# Patient Record
Sex: Male | Born: 1950 | Race: White | Hispanic: No | Marital: Married | Smoking: Never smoker
Health system: Southern US, Community
[De-identification: ages and names within clinical notes are randomized; demographics above are authoritative.]

## PROBLEM LIST (undated history)

## (undated) DIAGNOSIS — K589 Irritable bowel syndrome without diarrhea: Secondary | ICD-10-CM

## (undated) DIAGNOSIS — Z973 Presence of spectacles and contact lenses: Secondary | ICD-10-CM

## (undated) DIAGNOSIS — I82409 Acute embolism and thrombosis of unspecified deep veins of unspecified lower extremity: Secondary | ICD-10-CM

## (undated) HISTORY — PX: COLONOSCOPY: SHX174

## (undated) HISTORY — PX: TONSILLECTOMY: SUR1361

## (undated) HISTORY — DX: Acute embolism and thrombosis of unspecified deep veins of unspecified lower extremity: I82.409

---

## 2014-10-03 ENCOUNTER — Encounter (HOSPITAL_BASED_OUTPATIENT_CLINIC_OR_DEPARTMENT_OTHER): Payer: Self-pay | Admitting: *Deleted

## 2014-10-03 NOTE — Progress Notes (Signed)
No resp or cardiac problems

## 2014-10-04 ENCOUNTER — Other Ambulatory Visit: Payer: Self-pay | Admitting: Physician Assistant

## 2014-10-04 NOTE — H&P (Signed)
Mr. Austin MilesLundberg is a 64 year-old gentleman, new patient to me who comes to the office with concerns about his left ankle.  He fell off a barstool yesterday, awkwardly rolled his ankle and felt a pop.  Difficulty bearing weight.  He comes into the office in further evaluation with ankle pain and swelling.  His health upon review shows he has no allergies.  No history of diabetes.  Past medical history is significant for mild depression, on Lexapro 5 mg once daily.  Otherwise no other chronic medications.  Past surgical history is unremarkable.  Family history is negative upon review.  He is a nonsmoker, married and is retired.    OBJECTIVE: Height: 5?8.  Weight: 165 pounds.  Blood pressure: 120/80.  Pain and tenderness to the medial ankle, particularly over the medial malleolus.  Mild diffuse soft tissue swelling.  No symptoms over the lateral malleolus.  Achilles is intact.  The ankle is grossly stable.  No skin breakdown.  Good pulse.  He is neurovascularly intact distally.      X-RAYS: Three views of the ankle show a displaced medial malleolar fracture.  ASSESSMENT: Left ankle medial malleolar fracture, displaced.  PLAN: A posterior plaster splint.  Crutches and/or a knee walker for assisted weight bearing.  Percocet 5/325 #30 for pain.  Discussed with him at length the merits of ORIF, left ankle.  Risks, benefits and possible complications reviewed.  Rehab and recover time discussed.  We will see Onalee HuaDavid at time of operative intervention.  Otilio SaberM. Lindsey Tyleigh Mahn, PA-C

## 2014-10-05 ENCOUNTER — Ambulatory Visit (HOSPITAL_BASED_OUTPATIENT_CLINIC_OR_DEPARTMENT_OTHER): Payer: Medicare Other | Admitting: Certified Registered"

## 2014-10-05 ENCOUNTER — Encounter (HOSPITAL_BASED_OUTPATIENT_CLINIC_OR_DEPARTMENT_OTHER): Payer: Self-pay | Admitting: Certified Registered"

## 2014-10-05 ENCOUNTER — Encounter (HOSPITAL_BASED_OUTPATIENT_CLINIC_OR_DEPARTMENT_OTHER)
Admission: RE | Disposition: A | Discharge status: Home / Self Care | Payer: Self-pay | Source: Ambulatory Visit | Attending: Orthopedic Surgery

## 2014-10-05 ENCOUNTER — Ambulatory Visit (HOSPITAL_BASED_OUTPATIENT_CLINIC_OR_DEPARTMENT_OTHER)
Admission: RE | Admit: 2014-10-05 | Discharge: 2014-10-05 | Disposition: A | Discharge status: Home / Self Care | Payer: Medicare Other | Source: Ambulatory Visit | Attending: Orthopedic Surgery | Admitting: Orthopedic Surgery

## 2014-10-05 DIAGNOSIS — W08XXXA Fall from other furniture, initial encounter: Secondary | ICD-10-CM | POA: Insufficient documentation

## 2014-10-05 DIAGNOSIS — Y929 Unspecified place or not applicable: Secondary | ICD-10-CM | POA: Diagnosis not present

## 2014-10-05 DIAGNOSIS — Y999 Unspecified external cause status: Secondary | ICD-10-CM | POA: Diagnosis not present

## 2014-10-05 DIAGNOSIS — Y939 Activity, unspecified: Secondary | ICD-10-CM | POA: Diagnosis not present

## 2014-10-05 DIAGNOSIS — S8252XA Displaced fracture of medial malleolus of left tibia, initial encounter for closed fracture: Secondary | ICD-10-CM | POA: Insufficient documentation

## 2014-10-05 DIAGNOSIS — F329 Major depressive disorder, single episode, unspecified: Secondary | ICD-10-CM | POA: Diagnosis not present

## 2014-10-05 HISTORY — DX: Irritable bowel syndrome, unspecified: K58.9

## 2014-10-05 HISTORY — DX: Presence of spectacles and contact lenses: Z97.3

## 2014-10-05 HISTORY — PX: ORIF ANKLE FRACTURE: SHX5408

## 2014-10-05 SURGERY — OPEN REDUCTION INTERNAL FIXATION (ORIF) ANKLE FRACTURE
Anesthesia: Regional | Site: Ankle | Laterality: Left

## 2014-10-05 MED ORDER — LIDOCAINE HCL (CARDIAC) 20 MG/ML IV SOLN
INTRAVENOUS | Status: DC | PRN
  Administered 2014-10-05: 75 mg via INTRAVENOUS

## 2014-10-05 MED ORDER — HYDROCODONE-ACETAMINOPHEN 7.5-325 MG PO TABS: 1.0000 | ORAL_TABLET | ORAL | Status: DC | PRN

## 2014-10-05 MED ORDER — CHLORHEXIDINE GLUCONATE 4 % EX LIQD: 60.0000 mL | Freq: Once | CUTANEOUS | Status: DC

## 2014-10-05 MED ORDER — METHOCARBAMOL 1000 MG/10ML IJ SOLN: 500.0000 mg | Freq: Four times a day (QID) | INTRAVENOUS | Status: DC | PRN

## 2014-10-05 MED ORDER — METHOCARBAMOL 500 MG PO TABS: 500.0000 mg | ORAL_TABLET | Freq: Four times a day (QID) | ORAL | Status: DC | PRN

## 2014-10-05 MED ORDER — HYDROMORPHONE HCL 1 MG/ML IJ SOLN: 0.2500 mg | INTRAMUSCULAR | Status: DC | PRN

## 2014-10-05 MED ORDER — LACTATED RINGERS IV SOLN
INTRAVENOUS | Status: DC
  Administered 2014-10-05 (×2): via INTRAVENOUS

## 2014-10-05 MED ORDER — FENTANYL CITRATE 0.05 MG/ML IJ SOLN
INTRAMUSCULAR | Status: AC
  Filled 2014-10-05: qty 6

## 2014-10-05 MED ORDER — ONDANSETRON HCL 4 MG/2ML IJ SOLN
INTRAMUSCULAR | Status: DC | PRN
  Administered 2014-10-05: 4 mg via INTRAVENOUS

## 2014-10-05 MED ORDER — DEXAMETHASONE SODIUM PHOSPHATE 10 MG/ML IJ SOLN
INTRAMUSCULAR | Status: DC | PRN
  Administered 2014-10-05: 10 mg via INTRAVENOUS

## 2014-10-05 MED ORDER — FENTANYL CITRATE 0.05 MG/ML IJ SOLN
INTRAMUSCULAR | Status: DC | PRN
  Administered 2014-10-05 (×2): 50 ug via INTRAVENOUS

## 2014-10-05 MED ORDER — OXYCODONE HCL 5 MG PO TABS
5.0000 mg | ORAL_TABLET | Freq: Once | ORAL | Status: AC | PRN
  Administered 2014-10-05: 5 mg via ORAL

## 2014-10-05 MED ORDER — METOCLOPRAMIDE HCL 5 MG PO TABS: 5.0000 mg | ORAL_TABLET | Freq: Three times a day (TID) | ORAL | Status: DC | PRN

## 2014-10-05 MED ORDER — HYDROMORPHONE HCL 1 MG/ML IJ SOLN: 0.5000 mg | INTRAMUSCULAR | Status: DC | PRN

## 2014-10-05 MED ORDER — ONDANSETRON HCL 4 MG PO TABS: 4.0000 mg | ORAL_TABLET | Freq: Four times a day (QID) | ORAL | Status: DC | PRN

## 2014-10-05 MED ORDER — CEFAZOLIN SODIUM-DEXTROSE 2-3 GM-% IV SOLR
INTRAVENOUS | Status: AC
  Filled 2014-10-05: qty 50

## 2014-10-05 MED ORDER — MIDAZOLAM HCL 2 MG/2ML IJ SOLN
INTRAMUSCULAR | Status: AC
  Filled 2014-10-05: qty 2

## 2014-10-05 MED ORDER — OXYCODONE HCL 5 MG/5ML PO SOLN: 5.0000 mg | Freq: Once | ORAL | Status: AC | PRN

## 2014-10-05 MED ORDER — METOCLOPRAMIDE HCL 5 MG/ML IJ SOLN: 5.0000 mg | Freq: Three times a day (TID) | INTRAMUSCULAR | Status: DC | PRN

## 2014-10-05 MED ORDER — BUPIVACAINE-EPINEPHRINE (PF) 0.5% -1:200000 IJ SOLN
INTRAMUSCULAR | Status: DC | PRN
  Administered 2014-10-05: 20 mL via PERINEURAL

## 2014-10-05 MED ORDER — LACTATED RINGERS IV SOLN: INTRAVENOUS | Status: DC

## 2014-10-05 MED ORDER — FENTANYL CITRATE 0.05 MG/ML IJ SOLN
50.0000 ug | INTRAMUSCULAR | Status: DC | PRN
  Administered 2014-10-05: 100 ug via INTRAVENOUS

## 2014-10-05 MED ORDER — FENTANYL CITRATE 0.05 MG/ML IJ SOLN
INTRAMUSCULAR | Status: AC
  Filled 2014-10-05: qty 2

## 2014-10-05 MED ORDER — MIDAZOLAM HCL 2 MG/2ML IJ SOLN
1.0000 mg | INTRAMUSCULAR | Status: DC | PRN
  Administered 2014-10-05: 2 mg via INTRAVENOUS

## 2014-10-05 MED ORDER — PROPOFOL 10 MG/ML IV BOLUS
INTRAVENOUS | Status: DC | PRN
  Administered 2014-10-05: 200 mg via INTRAVENOUS

## 2014-10-05 MED ORDER — ONDANSETRON HCL 4 MG/2ML IJ SOLN: 4.0000 mg | Freq: Four times a day (QID) | INTRAMUSCULAR | Status: DC | PRN

## 2014-10-05 MED ORDER — ONDANSETRON HCL 4 MG/2ML IJ SOLN: 4.0000 mg | Freq: Once | INTRAMUSCULAR | Status: DC | PRN

## 2014-10-05 MED ORDER — CEFAZOLIN SODIUM-DEXTROSE 2-3 GM-% IV SOLR
2.0000 g | INTRAVENOUS | Status: AC
  Administered 2014-10-05: 2 g via INTRAVENOUS

## 2014-10-05 MED ORDER — OXYCODONE HCL 5 MG PO TABS
ORAL_TABLET | ORAL | Status: AC
  Filled 2014-10-05: qty 1

## 2014-10-05 SURGICAL SUPPLY — 65 items
BANDAGE ELASTIC 4 VELCRO ST LF (GAUZE/BANDAGES/DRESSINGS) ×2 IMPLANT
BANDAGE ELASTIC 6 VELCRO ST LF (GAUZE/BANDAGES/DRESSINGS) ×2 IMPLANT
BANDAGE ESMARK 6X9 LF (GAUZE/BANDAGES/DRESSINGS) ×1 IMPLANT
BENZOIN TINCTURE PRP APPL 2/3 (GAUZE/BANDAGES/DRESSINGS) IMPLANT
BIT DRILL CANN 2.7X625 NONSTRL (BIT) ×2 IMPLANT
BLADE SURG 15 STRL LF DISP TIS (BLADE) ×1 IMPLANT
BLADE SURG 15 STRL SS (BLADE) ×1
BNDG COHESIVE 4X5 TAN STRL (GAUZE/BANDAGES/DRESSINGS) ×2 IMPLANT
BNDG ESMARK 6X9 LF (GAUZE/BANDAGES/DRESSINGS) ×2
CANISTER SUCT 1200ML W/VALVE (MISCELLANEOUS) ×2 IMPLANT
COVER BACK TABLE 60X90IN (DRAPES) ×2 IMPLANT
CUFF TOURNIQUET SINGLE 18IN (TOURNIQUET CUFF) ×2 IMPLANT
CUFF TOURNIQUET SINGLE 34IN LL (TOURNIQUET CUFF) IMPLANT
DECANTER SPIKE VIAL GLASS SM (MISCELLANEOUS) IMPLANT
DRAPE EXTREMITY T 121X128X90 (DRAPE) ×2 IMPLANT
DRAPE OEC MINIVIEW 54X84 (DRAPES) ×2 IMPLANT
DRAPE SURG 17X23 STRL (DRAPES) ×2 IMPLANT
DRAPE U 20/CS (DRAPES) ×2 IMPLANT
DRAPE U-SHAPE 47X51 STRL (DRAPES) ×2 IMPLANT
DRSG PAD ABDOMINAL 8X10 ST (GAUZE/BANDAGES/DRESSINGS) ×2 IMPLANT
DURAPREP 26ML APPLICATOR (WOUND CARE) ×2 IMPLANT
ELECT REM PT RETURN 9FT ADLT (ELECTROSURGICAL) ×2
ELECTRODE REM PT RTRN 9FT ADLT (ELECTROSURGICAL) ×1 IMPLANT
GAUZE SPONGE 4X4 12PLY STRL (GAUZE/BANDAGES/DRESSINGS) ×2 IMPLANT
GAUZE XEROFORM 1X8 LF (GAUZE/BANDAGES/DRESSINGS) ×2 IMPLANT
GLOVE BIOGEL PI IND STRL 7.0 (GLOVE) ×1 IMPLANT
GLOVE BIOGEL PI INDICATOR 7.0 (GLOVE) ×1
GLOVE ECLIPSE 6.5 STRL STRAW (GLOVE) ×2 IMPLANT
GLOVE ECLIPSE 7.0 STRL STRAW (GLOVE) ×2 IMPLANT
GLOVE ORTHO TXT STRL SZ7.5 (GLOVE) ×4 IMPLANT
GLOVE SURG ORTHO 8.0 STRL STRW (GLOVE) ×2 IMPLANT
GOWN STRL REUS W/ TWL LRG LVL3 (GOWN DISPOSABLE) ×2 IMPLANT
GOWN STRL REUS W/ TWL XL LVL3 (GOWN DISPOSABLE) ×1 IMPLANT
GOWN STRL REUS W/TWL LRG LVL3 (GOWN DISPOSABLE) ×2
GOWN STRL REUS W/TWL XL LVL3 (GOWN DISPOSABLE) ×1 IMPLANT
GUIDEWIRE THREADED 150MM (WIRE) ×4 IMPLANT
NEEDLE HYPO 25X1 1.5 SAFETY (NEEDLE) IMPLANT
NS IRRIG 1000ML POUR BTL (IV SOLUTION) ×2 IMPLANT
PACK BASIN DAY SURGERY FS (CUSTOM PROCEDURE TRAY) ×2 IMPLANT
PAD CAST 4YDX4 CTTN HI CHSV (CAST SUPPLIES) ×1 IMPLANT
PADDING CAST COTTON 4X4 STRL (CAST SUPPLIES) ×1
PADDING CAST COTTON 6X4 STRL (CAST SUPPLIES) ×2 IMPLANT
PENCIL BUTTON HOLSTER BLD 10FT (ELECTRODE) IMPLANT
SCREW CANN L THRD/30 4.0 (Screw) ×2 IMPLANT
SLEEVE SCD COMPRESS KNEE MED (MISCELLANEOUS) ×2 IMPLANT
SPLINT FAST PLASTER 5X30 (CAST SUPPLIES) ×20
SPLINT FIBERGLASS 4X30 (CAST SUPPLIES) IMPLANT
SPLINT PLASTER CAST FAST 5X30 (CAST SUPPLIES) ×20 IMPLANT
SPONGE LAP 4X18 X RAY DECT (DISPOSABLE) ×2 IMPLANT
STAPLER VISISTAT 35W (STAPLE) IMPLANT
STOCKINETTE 6  STRL (DRAPES) ×1
STOCKINETTE 6 STRL (DRAPES) ×1 IMPLANT
STRIP CLOSURE SKIN 1/2X4 (GAUZE/BANDAGES/DRESSINGS) IMPLANT
SUCTION FRAZIER TIP 10 FR DISP (SUCTIONS) ×2 IMPLANT
SUT ETHILON 3 0 PS 1 (SUTURE) ×2 IMPLANT
SUT VIC AB 0 CT1 27 (SUTURE) ×1
SUT VIC AB 0 CT1 27XBRD ANBCTR (SUTURE) ×1 IMPLANT
SUT VIC AB 2-0 SH 27 (SUTURE)
SUT VIC AB 2-0 SH 27XBRD (SUTURE) IMPLANT
SUT VICRYL 4-0 PS2 18IN ABS (SUTURE) IMPLANT
SYR BULB 3OZ (MISCELLANEOUS) ×2 IMPLANT
SYR CONTROL 10ML LL (SYRINGE) IMPLANT
TUBE CONNECTING 20X1/4 (TUBING) ×2 IMPLANT
UNDERPAD 30X30 INCONTINENT (UNDERPADS AND DIAPERS) ×2 IMPLANT
YANKAUER SUCT BULB TIP NO VENT (SUCTIONS) IMPLANT

## 2014-10-05 NOTE — Anesthesia Procedure Notes (Addendum)
Anesthesia Regional Block:  Adductor canal block  Pre-Anesthetic Checklist: ,, timeout performed, Correct Patient, Correct Site, Correct Laterality, Correct Procedure, Correct Position, site marked, Risks and benefits discussed,  Surgical consent,  Pre-op evaluation,  At surgeon's request and post-op pain management  Laterality: Left and Lower  Prep: chloraprep       Needles:  Injection technique: Single-shot  Needle Type: Echogenic Needle     Needle Length: 9cm 9 cm Needle Gauge: 21 and 21 G    Additional Needles:  Procedures: ultrasound guided (picture in chart) Adductor canal block Narrative:  Start time: 10/05/2014 9:24 AM End time: 10/05/2014 9:29 AM Injection made incrementally with aspirations every 5 mL.  Performed by: Personally  Anesthesiologist: CREWS, Asar   Procedure Name: LMA Insertion Date/Time: 10/05/2014 9:48 AM Performed by: Curly ShoresRAFT, Cortney Mckinney W Pre-anesthesia Checklist: Patient identified, Emergency Drugs available, Suction available and Patient being monitored Patient Re-evaluated:Patient Re-evaluated prior to inductionOxygen Delivery Method: Circle System Utilized Preoxygenation: Pre-oxygenation with 100% oxygen Intubation Type: IV induction Ventilation: Mask ventilation without difficulty LMA: LMA inserted LMA Size: 5.0 Number of attempts: 1 Airway Equipment and Method: Bite block Placement Confirmation: positive ETCO2 and breath sounds checked- equal and bilateral Tube secured with: Tape Dental Injury: Teeth and Oropharynx as per pre-operative assessment

## 2014-10-05 NOTE — Interval H&P Note (Signed)
History and Physical Interval Note:  10/05/2014 7:33 AM  Keith Wheeler  has presented today for surgery, with the diagnosis of DISPLACED FRACTURE OF MEDIAL MALLEOLUS OF UNSPECIFIED TIBIA/INITIAL ENCOUNTER FOR CLOSED FRACTURE   The various methods of treatment have been discussed with the patient and family. After consideration of risks, benefits and other options for treatment, the patient has consented to  Procedure(s) with comments: LEFT ANKLE OPEN REDUCTION INTERNAL FIXATION (ORIF) MEDIAL MALLEOLUS (Left) - ANESTHESIA:  GENERAL, BLOCK as a surgical intervention .  The patient's history has been reviewed, patient examined, no change in status, stable for surgery.  I have reviewed the patient's chart and labs.  Questions were answered to the patient's satisfaction.     Theta Leaf F

## 2014-10-05 NOTE — Transfer of Care (Signed)
Immediate Anesthesia Transfer of Care Note  Patient: Keith FeltyDavid J Lindfors  Procedure(s) Performed: Procedure(s) with comments: LEFT ANKLE OPEN REDUCTION INTERNAL FIXATION (ORIF) MEDIAL MALLEOLUS (Left) - ANESTHESIA:  GENERAL, BLOCK  Patient Location: PACU  Anesthesia Type:GA combined with regional for post-op pain  Level of Consciousness: awake and patient cooperative  Airway & Oxygen Therapy: Patient Spontanous Breathing and Patient connected to face mask oxygen  Post-op Assessment: Report given to RN, Post -op Vital signs reviewed and stable and Patient moving all extremities  Post vital signs: Reviewed and stable  Last Vitals:  Filed Vitals:   10/05/14 0935  BP:   Pulse: 55  Temp:   Resp: 11    Complications: No apparent anesthesia complications

## 2014-10-05 NOTE — Anesthesia Preprocedure Evaluation (Signed)
Anesthesia Evaluation  Patient identified by MRN, date of birth, ID band Patient awake    Reviewed: Allergy & Precautions, NPO status , Patient's Chart, lab work & pertinent test results  Airway Mallampati: I  TM Distance: >3 FB Neck ROM: Full    Dental  (+) Teeth Intact, Dental Advisory Given   Pulmonary  breath sounds clear to auscultation        Cardiovascular Rhythm:Regular Rate:Normal     Neuro/Psych    GI/Hepatic   Endo/Other    Renal/GU      Musculoskeletal   Abdominal   Peds  Hematology   Anesthesia Other Findings   Reproductive/Obstetrics                             Anesthesia Physical Anesthesia Plan  ASA: II  Anesthesia Plan: General   Post-op Pain Management:    Induction: Intravenous  Airway Management Planned: LMA  Additional Equipment:   Intra-op Plan:   Post-operative Plan: Extubation in OR  Informed Consent: I have reviewed the patients History and Physical, chart, labs and discussed the procedure including the risks, benefits and alternatives for the proposed anesthesia with the patient or authorized representative who has indicated his/her understanding and acceptance.   Dental advisory given  Plan Discussed with: CRNA, Anesthesiologist and Surgeon  Anesthesia Plan Comments:         Anesthesia Quick Evaluation  

## 2014-10-05 NOTE — Anesthesia Postprocedure Evaluation (Signed)
  Anesthesia Post-op Note  Patient: Keith FeltyDavid J Feliz  Procedure(s) Performed: Procedure(s) with comments: LEFT ANKLE OPEN REDUCTION INTERNAL FIXATION (ORIF) MEDIAL MALLEOLUS (Left) - ANESTHESIA:  GENERAL, BLOCK  Patient Location: PACU  Anesthesia Type: General, Regional   Level of Consciousness: awake, alert  and oriented  Airway and Oxygen Therapy: Patient Spontanous Breathing  Post-op Pain: mild  Post-op Assessment: Post-op Vital signs reviewed  Post-op Vital Signs: Reviewed  Last Vitals:  Filed Vitals:   10/05/14 1245  BP: 150/90  Pulse: 62  Temp: 36.8 C  Resp: 16    Complications: No apparent anesthesia complications

## 2014-10-05 NOTE — H&P (View-Only) (Signed)
Mr. Keith Wheeler is a 64 year-old gentleman, new patient to me who comes to the office with concerns about his left ankle.  He fell off a barstool yesterday, awkwardly rolled his ankle and felt a pop.  Difficulty bearing weight.  He comes into the office in further evaluation with ankle pain and swelling.  His health upon review shows he has no allergies.  No history of diabetes.  Past medical history is significant for mild depression, on Lexapro 5 mg once daily.  Otherwise no other chronic medications.  Past surgical history is unremarkable.  Family history is negative upon review.  He is a nonsmoker, married and is retired.    OBJECTIVE: Height: 5?8.  Weight: 165 pounds.  Blood pressure: 120/80.  Pain and tenderness to the medial ankle, particularly over the medial malleolus.  Mild diffuse soft tissue swelling.  No symptoms over the lateral malleolus.  Achilles is intact.  The ankle is grossly stable.  No skin breakdown.  Good pulse.  He is neurovascularly intact distally.      X-RAYS: Three views of the ankle show a displaced medial malleolar fracture.  ASSESSMENT: Left ankle medial malleolar fracture, displaced.  PLAN: A posterior plaster splint.  Crutches and/or a knee walker for assisted weight bearing.  Percocet 5/325 #30 for pain.  Discussed with him at length the merits of ORIF, left ankle.  Risks, benefits and possible complications reviewed.  Rehab and recover time discussed.  We will see Srihith at time of operative intervention.  M. Lindsey Stanbery, PA-C 

## 2014-10-05 NOTE — Progress Notes (Signed)
Assisted Dr. Crews with left, ultrasound guided, popliteal/saphenous block. Side rails up, monitors on throughout procedure. See vital signs in flow sheet. Tolerated Procedure well. 

## 2014-10-05 NOTE — Discharge Instructions (Signed)
HOME CARE INSTRUCTIONS  Non-weight bearing.  Do not remove splint until seen in office.  May shower, but do not let splint get wet.  Follow up appointment in one week.   You may resume normal diet and activities as directed or allowed.  Elevate your ankle above your heart as much as possible for the first 24-48 hrs postoperatively.  Change dressings if necessary or as directed.  If you have a plaster or fiberglass cast:  Do not try to scratch the skin under the cast using sharp or pointed objects.  Check the skin around the cast every day. You may put lotion on any red or sore areas.  Keep your cast dry and clean.  Your cast or splint can be protected during bathing with a plastic bag. Do not lower the cast or splint into water.  Only take over-the-counter or prescription medicines for pain, discomfort, or fever as directed by your caregiver.  Use crutches as directed and do not exercise leg unless instructed.  Keep appointments as directed. SEEK IMMEDIATE MEDICAL CARE IF:  There is redness, swelling, or increasing pain in the wound.  There is pus coming from wound.  You have an unexplained oral temperature above 102 F (38.9 C) develops.  There is a bad smell coming from the wound or dressing.  You have increasing pain coming from the area of the wound. Especially when someone else moves your toes.  The wound breaks open (edges not staying together) after sutures or staples have been removed. If you do not have a window in your cast for observing the wound, a discharge or minor bleeding may show up as a stain on the outside of your cast. Report these findings to your caregiver. Document Released: 03/22/2002 Document Revised: 11/14/2013 Document Reviewed: 09/22/2007 [facility] Patient Information 2015 [employer] [place]. This information is not intended to replace advice given to you by your health care provider. Make sure you discuss any questions you have with your health care  provider.    Post Anesthesia Home Care Instructions  Activity: Get plenty of rest for the remainder of the day. A responsible adult should stay with you for 24 hours following the procedure.  For the next 24 hours, DO NOT: -Drive a car -Advertising copywriterperate machinery -Drink alcoholic beverages -Take any medication unless instructed by your physician -Make any legal decisions or sign important papers.  Meals: Start with liquid foods such as gelatin or soup. Progress to regular foods as tolerated. Avoid greasy, spicy, heavy foods. If nausea and/or vomiting occur, drink only clear liquids until the nausea and/or vomiting subsides. Call your physician if vomiting continues.  Special Instructions/Symptoms: Your throat may feel dry or sore from the anesthesia or the breathing tube placed in your throat during surgery. If this causes discomfort, gargle with warm salt water. The discomfort should disappear within 24 hours.

## 2014-10-06 NOTE — Op Note (Signed)
NAMCannon [place]:  Wheeler, Keith             ACCOUNT NO.:  [id number]  MEDICAL RECORD NO.:  [id number]  LOCATION:                               FACILITY:  [facility]  PHYSICIAN:  Loreta Aveaniel F. Murphy, M.D. DATE OF BIRTH:  1950-11-16  DATE OF PROCEDURE:  10/05/2014 DATE OF DISCHARGE:  10/05/2014                              OPERATIVE REPORT   PREOPERATIVE DIAGNOSIS:  Markedly displaced distal medial malleolus fracture, left ankle.  POSTOPERATIVE DIAGNOSIS:  Markedly displaced distal medial malleolus fracture, left ankle.  PROCEDURE:  Open reduction and internal fixation of left ankle fracture with a single cannulated 35 mm x 4 mm screw.  SURGEON:  Loreta Aveaniel F. Murphy, M.D.  ASSISTANT:  Keith Wheeler, P.A.  ANESTHESIA:  General.  BLOOD LOSS:  Minimal.  SPECIMENS:  None.  CULTURES:  None.  COMPLICATIONS:  None.  DRESSINGS:  Sterile compressive, short leg splint.  TOURNIQUET TIME:  45 minutes.  PROCEDURE IN DETAIL:  The patient was brought to operating room, placed on the operating table in supine position.  After adequate anesthesia had been obtained, tourniquet applied.  Prepped and draped in usual sterile fashion.  Exsanguinated with elevation of Esmarch.  Tourniquet inflated to 350 mmHg.  Fluoroscopic guidance was used throughout.  Ankle was stable laterally and syndesmosis appeared on the medial side.  The distal end of the medial malleolus had avulse off, tilted 30 degrees into the joint, and then forwarded so that it was displaced and unstable.  This approached with a vertical incision.  Skin and subcutaneous tissue divided.  Fracture identified, cleared out, reduced anatomically.  This is a relatively small fragment.  I temporarily fixed with 2 guidewires and then permanently with a single screw to the middle of the fragment.  This was small enough, I could not get a second screw in.  Fortunately, the fracture pattern was such that rotational stability was good with a single  screw alone.  This was countersunk as much as possible.  Alignment checked, it was good.  Great motion.  Wound irrigated.  Closed with nylon.  Sterile compressive dressing applied.  Short-leg splint applied.  Tourniquet deflated and removed.  Anesthesia reversed.  Brought to the recovery room.  Tolerated the surgery well.  No complications.     Loreta Aveaniel F. Murphy, M.D.     DFM/MEDQ  D:  10/05/2014  T:  10/05/2014  Job:  [id number]

## 2014-10-10 ENCOUNTER — Encounter (HOSPITAL_BASED_OUTPATIENT_CLINIC_OR_DEPARTMENT_OTHER): Payer: Self-pay | Admitting: Orthopedic Surgery

## 2018-04-11 ENCOUNTER — Emergency Department (HOSPITAL_COMMUNITY): Payer: Medicare Other

## 2018-04-11 ENCOUNTER — Emergency Department (HOSPITAL_COMMUNITY)
Admission: EM | Admit: 2018-04-11 | Discharge: 2018-04-11 | Disposition: A | Discharge status: Home / Self Care | Payer: Medicare Other | Attending: Emergency Medicine | Admitting: Emergency Medicine

## 2018-04-11 ENCOUNTER — Other Ambulatory Visit: Payer: Self-pay

## 2018-04-11 ENCOUNTER — Encounter (HOSPITAL_COMMUNITY): Payer: Self-pay | Admitting: *Deleted

## 2018-04-11 DIAGNOSIS — N201 Calculus of ureter: Secondary | ICD-10-CM | POA: Insufficient documentation

## 2018-04-11 DIAGNOSIS — Z79899 Other long term (current) drug therapy: Secondary | ICD-10-CM | POA: Insufficient documentation

## 2018-04-11 DIAGNOSIS — R1031 Right lower quadrant pain: Secondary | ICD-10-CM | POA: Diagnosis present

## 2018-04-11 DIAGNOSIS — N200 Calculus of kidney: Secondary | ICD-10-CM

## 2018-04-11 LAB — URINALYSIS, ROUTINE W REFLEX MICROSCOPIC
BACTERIA UA: NONE SEEN
Bilirubin Urine: NEGATIVE
GLUCOSE, UA: NEGATIVE mg/dL
KETONES UR: 20 mg/dL — AB
LEUKOCYTES UA: NEGATIVE
Nitrite: NEGATIVE
PROTEIN: NEGATIVE mg/dL
RBC / HPF: >50 RBC/hpf — ABNORMAL HIGH (ref 0–5)
Specific Gravity, Urine: 1.016 (ref 1.005–1.030)
pH: 5 (ref 5.0–8.0)

## 2018-04-11 LAB — COMPREHENSIVE METABOLIC PANEL
ALT: 58 U/L — AB (ref 0–44)
AST: 68 U/L — AB (ref 15–41)
Albumin: 4.1 g/dL (ref 3.5–5.0)
Alkaline Phosphatase: 59 U/L (ref 38–126)
Anion gap: 11 (ref 5–15)
BUN: 12 mg/dL (ref 8–23)
CHLORIDE: 105 mmol/L (ref 98–111)
CO2: 26 mmol/L (ref 22–32)
CREATININE: 0.95 mg/dL (ref 0.61–1.24)
Calcium: 9.1 mg/dL (ref 8.9–10.3)
GFR calc Af Amer: >60 mL/min (ref >60)
GFR calc non Af Amer: >60 mL/min (ref >60)
GLUCOSE: 126 mg/dL — AB (ref 70–99)
POTASSIUM: 3.3 mmol/L — AB (ref 3.5–5.1)
Sodium: 142 mmol/L (ref 135–145)
Total Bilirubin: 1.8 mg/dL — ABNORMAL HIGH (ref 0.3–1.2)
Total Protein: 7 g/dL (ref 6.5–8.1)

## 2018-04-11 LAB — CBC
HCT: 44.7 % (ref 39.0–52.0)
Hemoglobin: 15.5 g/dL (ref 13.0–17.0)
MCH: 34.8 pg — AB (ref 26.0–34.0)
MCHC: 34.7 g/dL (ref 30.0–36.0)
MCV: 100.4 fL — AB (ref 78.0–100.0)
PLATELETS: 164 10*3/uL (ref 150–400)
RBC: 4.45 MIL/uL (ref 4.22–5.81)
RDW: 12.2 % (ref 11.5–15.5)
WBC: 4.8 10*3/uL (ref 4.0–10.5)

## 2018-04-11 LAB — LIPASE, BLOOD: LIPASE: 40 U/L (ref 11–51)

## 2018-04-11 MED ORDER — TAMSULOSIN HCL 0.4 MG PO CAPS: 0.4000 mg | ORAL_CAPSULE | Freq: Every day | ORAL | 0 refills | Status: DC

## 2018-04-11 MED ORDER — IBUPROFEN 600 MG PO TABS: 600.0000 mg | ORAL_TABLET | Freq: Four times a day (QID) | ORAL | 0 refills | Status: DC | PRN

## 2018-04-11 MED ORDER — FENTANYL CITRATE (PF) 100 MCG/2ML IJ SOLN
50.0000 ug | INTRAMUSCULAR | Status: DC | PRN
  Administered 2018-04-11: 50 ug via INTRAVENOUS
  Filled 2018-04-11: qty 2

## 2018-04-11 MED ORDER — ONDANSETRON 4 MG PO TBDP: 4.0000 mg | ORAL_TABLET | ORAL | 0 refills | Status: DC | PRN

## 2018-04-11 MED ORDER — DOCUSATE SODIUM 100 MG PO CAPS: 100.0000 mg | ORAL_CAPSULE | Freq: Two times a day (BID) | ORAL | 0 refills | Status: DC

## 2018-04-11 MED ORDER — ONDANSETRON HCL 4 MG/2ML IJ SOLN
4.0000 mg | Freq: Once | INTRAMUSCULAR | Status: AC | PRN
  Administered 2018-04-11: 4 mg via INTRAVENOUS
  Filled 2018-04-11: qty 2

## 2018-04-11 MED ORDER — OXYCODONE-ACETAMINOPHEN 5-325 MG PO TABS: 1.0000 | ORAL_TABLET | Freq: Four times a day (QID) | ORAL | 0 refills | Status: DC | PRN

## 2018-04-11 NOTE — ED Notes (Signed)
Pt using restroom

## 2018-04-11 NOTE — ED Notes (Signed)
Patient transported to CT 

## 2018-04-11 NOTE — ED Notes (Signed)
Pt unable to sign due to E-Signature pad not working at time.

## 2018-04-11 NOTE — ED Notes (Signed)
Pt aware urine sample is needed 

## 2018-04-11 NOTE — Discharge Instructions (Addendum)
1.  Take ibuprofen for the next 2 days for pain control.  Then you may move to as needed use.  Take Percocet if pain is starting to escalate.  Take Zofran if nausea. 2.  Return to the emergency department if pain cannot be controlled at home by medications. 3.  Follow-up with your urologist or [facility] within the next week. 4.  Have your family doctor and your urologist review the results of your CT scan for incidental findings that may need further evaluation or monitoring.  This was reviewed with you in the emergency department.

## 2018-04-11 NOTE — ED Triage Notes (Signed)
Pt reports sudden onset of constant lower abdominal pain tonight, associated with nausea and vomiting. Pt denies urinary symptoms.

## 2018-04-11 NOTE — ED Provider Notes (Signed)
[facility]-EMERGENCY DEPT Provider Note   CSN: [id number] Arrival date & time: 04/11/18  0531     History   Chief Complaint Chief Complaint  Patient presents with  . Abdominal Pain    HPI Keith Wheeler is a 67 y.o. male.  HPI Patient awakened from sleep by severe pain in his right inguinal region.  Very sharp.  He could not find any comfortable position.  Tried to drink some water and immediately vomited.  No associated pain or burning with urination.  Patient has not seen blood in the urine.  He had a normal day yesterday.  He went to the [place] vessel dance and felt well all day.  He reports just before getting pain medications in the emergency department, the pain suddenly went away.  He reports he has no pain now.  No history of kidney stones.  Otherwise healthy. Past Medical History:  Diagnosis Date  . IBS (irritable bowel syndrome)   . Wears glasses     There are no active problems to display for this patient.   Past Surgical History:  Procedure Laterality Date  . COLONOSCOPY    . ORIF ANKLE FRACTURE Left 10/05/2014   Procedure: LEFT ANKLE OPEN REDUCTION INTERNAL FIXATION (ORIF) MEDIAL MALLEOLUS;  Surgeon: Mckinley Jewel, MD;  Location: [facility];  Service: Orthopedics;  Laterality: Left;  ANESTHESIA:  GENERAL, BLOCK  . TONSILLECTOMY          Home Medications    Prior to Admission medications   Medication Sig Start Date End Date Taking? Authorizing Provider  escitalopram (LEXAPRO) 5 MG tablet Take 5 mg by mouth at bedtime.   Yes [provider]  Multiple Vitamins-Minerals (MULTIVITAMIN WITH MINERALS) tablet Take 1 tablet by mouth daily.   Yes [provider]  NON FORMULARY every morning. ostragalus root   Yes [provider]  docusate sodium (COLACE) 100 MG capsule Take 1 capsule (100 mg total) by mouth every 12 (twelve) hours. 04/11/18   Arby Barrette, MD  ibuprofen (ADVIL,MOTRIN) 600 MG tablet  Take 1 tablet (600 mg total) by mouth every 6 (six) hours as needed. 04/11/18   Arby Barrette, MD  ondansetron (ZOFRAN ODT) 4 MG disintegrating tablet Take 1 tablet (4 mg total) by mouth every 4 (four) hours as needed for nausea or vomiting. 04/11/18   Arby Barrette, MD  oxyCODONE-acetaminophen (PERCOCET) 5-325 MG tablet Take 1-2 tablets by mouth every 6 (six) hours as needed. 04/11/18   Arby Barrette, MD  tamsulosin (FLOMAX) 0.4 MG CAPS capsule Take 1 capsule (0.4 mg total) by mouth daily. 04/11/18   Arby Barrette, MD    Family History No family history on file.  Social History Social History   Tobacco Use  . Smoking status: Never Smoker  Substance Use Topics  . Alcohol use: Yes    Comment: 4 glasses wine a night  . Drug use: No     Allergies   Rice and Sardine oil   Review of Systems Review of Systems 10 Systems reviewed and are negative for acute change except as noted in the HPI.   Physical Exam Updated Vital Signs BP 129/89   Pulse 71   Temp 97.7 F (36.5 C)   Resp 17   SpO2 100%   Physical Exam  Constitutional: He is oriented to person, place, and time. He appears well-developed and well-nourished. No distress.  HENT:  Head: Normocephalic and atraumatic.  Eyes: EOM are normal.  Cardiovascular: Normal rate, regular  rhythm, normal heart sounds and intact distal pulses.  Pulmonary/Chest: Effort normal and breath sounds normal.  Abdominal: Soft. He exhibits no distension. There is no tenderness. There is no guarding.  Genitourinary:  Genitourinary Comments: No pain to palpation in the inguinal canal.  No mass or fullness within the right inguinal canal with Valsalva maneuver.  Right testicle nontender normal contours.  No scrotal edema or swelling.  Penis normal.  Musculoskeletal: Normal range of motion. He exhibits no edema or tenderness.  Neurological: He is alert and oriented to person, place, and time. He exhibits normal muscle tone. Coordination normal.      ED Treatments / Results  Labs (all labs ordered are listed, but only abnormal results are displayed) Labs Reviewed  COMPREHENSIVE METABOLIC PANEL - Abnormal; Notable for the following components:      Result Value   Potassium 3.3 (*)    Glucose, Bld 126 (*)    AST 68 (*)    ALT 58 (*)    Total Bilirubin 1.8 (*)    All other components within normal limits  CBC - Abnormal; Notable for the following components:   MCV 100.4 (*)    MCH 34.8 (*)    All other components within normal limits  URINALYSIS, ROUTINE W REFLEX MICROSCOPIC - Abnormal; Notable for the following components:   Hgb urine dipstick LARGE (*)    Ketones, ur 20 (*)    RBC / HPF >50 (*)    All other components within normal limits  LIPASE, BLOOD    EKG None  Radiology Ct Renal Stone Study  Result Date: 04/11/2018 CLINICAL DATA:  Lower abdominal and inguinal region pain on the right EXAM: CT ABDOMEN AND PELVIS WITHOUT CONTRAST TECHNIQUE: Multidetector CT imaging of the abdomen and pelvis was performed following the standard protocol without oral or IV contrast. COMPARISON:  None. FINDINGS: Lower chest: There is a small area of infiltrate in the lateral left base. There is mild bibasilar atelectasis as well. There is a small hiatal hernia. Hepatobiliary: There is hepatic steatosis. There is a 1.1 x 0.7 cm focus of decreased attenuation in the posterior segment right lobe of the liver, a probable small cyst. No other focal liver lesion identified on this noncontrast enhanced study. The gallbladder wall is not appreciably thickened. There is no biliary duct dilatation. Pancreas: There is no pancreatic mass or inflammatory focus. Spleen: No splenic lesions are evident. Adrenals/Urinary Tract: Adrenals bilaterally appear unremarkable. There is a cyst in the medial mid right kidney measuring 1.2 x 1.2 cm. There is an extrarenal pelvis on each side, an anatomic variant. There is slight hydronephrosis on the right. There is no  appreciable hydronephrosis on the left. There is a 2 mm calculus in the medial mid right kidney posteriorly. There is a 3 x 2 mm calculus at the right ureterovesical junction. No other ureteral calculi are evident on either side. There is a phlebolith near the distal left ureter but separate from the distal left ureter. Urinary bladder is midline with wall thickness overall within normal limits. Stomach/Bowel: There is no appreciable bowel wall or mesenteric thickening. There is no evident bowel obstruction. There is no free air or portal venous air. Vascular/Lymphatic: There is atherosclerotic calcification in the aorta and right common iliac artery. No aneurysm evident. Major mesenteric arterial vessels appear patent on this noncontrast enhanced study. There is no appreciable adenopathy in the abdomen or pelvis. Reproductive: There are multiple prostatic calculi. The prostate abuts the inferior aspect of the  urinary bladder with loss of fat plane between the inferior bladder and superior prostate. Seminal vesicles appear within normal limits. No pelvic mass evident. Other: Appendix appears normal. No abscess or ascites is evident in the abdomen or pelvis. No inguinal region lesions evident. Musculoskeletal: There is a small bone island in the left iliac crest. A second presumed small bone island is noted in the posterior right acetabulum. No lytic or destructive bone lesions are evident. No intramuscular or abdominal wall lesion evident. IMPRESSION: 1. There is a 3 x 2 mm calculus at the right ureterovesical junction causing mild hydronephrosis on the right. 2. 2 mm nonobstructing calculus medial posterior aspect of the right kidney midportion. 3. Multiple prostatic calculi. Loss of fat plane between the prostate and inferior bladder. This finding warrants direct clinical assessment of the prostate and PSA assessment. 4. Focal area of infiltrate lateral left base, felt to represent a degree of pneumonia. 5. No  evident bowel obstruction. No abscess in the abdomen or pelvis. Appendix appears normal. 6.  Hepatic steatosis. 7.  Aortoiliac atherosclerosis. Aortic Atherosclerosis (ICD10-I70.0).1 Electronically Signed   By: Bretta Bang III M.D.   On: 04/11/2018 11:42    Procedures Procedures (including critical care time)  Medications Ordered in ED Medications  fentaNYL (SUBLIMAZE) injection 50 mcg (50 mcg Intravenous Given 04/11/18 0612)  ondansetron (ZOFRAN) injection 4 mg (4 mg Intravenous Given 04/11/18 0612)     Initial Impression / Assessment and Plan / ED Course  I have reviewed the triage vital signs and the nursing notes.  Pertinent labs & imaging results that were available during my care of the patient were reviewed by me and considered in my medical decision making (see chart for details).  Clinical Course as of Apr 11 1325  Wynelle Link Apr 11, 2018  1001 CT scan back up, awaiting CT scan for final disposition.   [MP]  1141 Awaiting CT interpretation from CT scan ordered at 8: 30 a.m.   [MP]    Clinical Course User Index [MP] Arby Barrette, MD   Has been pain control since arrival to the emergency department.  CT scan confirms 3 mm stone.  Patient is stable for attempted passage at home.  He is counseled on signs and symptoms were to return.  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   Incidental left lower lobe infiltrate patient reports history of pneumonia in that area.  He is completely asymptomatic.  He will continue to follow this with his PCP.  Prostatic nephrolithiasis.  Patient reports he gets his PSAs checked annually and will make his doctor aware of the CT finding.  Final Clinical Impressions(s) / ED Diagnoses   Final diagnoses:  Kidney stone    ED Discharge Orders         Ordered    oxyCODONE-acetaminophen (PERCOCET) 5-325 MG tablet  Every 6 hours PRN     04/11/18 1323    ibuprofen (ADVIL,MOTRIN)  600 MG tablet  Every 6 hours PRN     04/11/18 1323    ondansetron (ZOFRAN ODT) 4 MG disintegrating tablet  Every 4 hours PRN     04/11/18 1323    tamsulosin (FLOMAX) 0.4 MG CAPS capsule  Daily     04/11/18 1323    docusate sodium (COLACE) 100 MG capsule  Every 12 hours     04/11/18 1323           Arby Barrette, MD 04/11/18 1327

## 2019-05-10 ENCOUNTER — Other Ambulatory Visit: Payer: Self-pay | Admitting: Family Medicine

## 2019-05-10 DIAGNOSIS — E7800 Pure hypercholesterolemia, unspecified: Secondary | ICD-10-CM

## 2019-05-16 ENCOUNTER — Other Ambulatory Visit: Payer: Medicare Other

## 2019-05-18 ENCOUNTER — Other Ambulatory Visit: Payer: Medicare Other

## 2019-05-23 ENCOUNTER — Ambulatory Visit
Admission: RE | Admit: 2019-05-23 | Discharge: 2019-05-23 | Disposition: A | Discharge status: Home / Self Care | Payer: Medicare Other | Source: Ambulatory Visit | Attending: Family Medicine | Admitting: Family Medicine

## 2019-05-23 DIAGNOSIS — E7800 Pure hypercholesterolemia, unspecified: Secondary | ICD-10-CM

## 2019-06-23 ENCOUNTER — Encounter (HOSPITAL_BASED_OUTPATIENT_CLINIC_OR_DEPARTMENT_OTHER): Payer: Self-pay | Admitting: Emergency Medicine

## 2019-06-23 ENCOUNTER — Other Ambulatory Visit: Payer: Self-pay

## 2019-06-23 ENCOUNTER — Emergency Department (HOSPITAL_BASED_OUTPATIENT_CLINIC_OR_DEPARTMENT_OTHER): Payer: Medicare Other

## 2019-06-23 ENCOUNTER — Emergency Department (HOSPITAL_BASED_OUTPATIENT_CLINIC_OR_DEPARTMENT_OTHER)
Admission: EM | Admit: 2019-06-23 | Discharge: 2019-06-23 | Disposition: A | Discharge status: Home / Self Care | Payer: Medicare Other | Attending: Emergency Medicine | Admitting: Emergency Medicine

## 2019-06-23 DIAGNOSIS — I8001 Phlebitis and thrombophlebitis of superficial vessels of right lower extremity: Secondary | ICD-10-CM | POA: Diagnosis not present

## 2019-06-23 DIAGNOSIS — M79604 Pain in right leg: Secondary | ICD-10-CM | POA: Diagnosis present

## 2019-06-23 LAB — COMPREHENSIVE METABOLIC PANEL
ALT: 21 U/L (ref 0–44)
AST: 31 U/L (ref 15–41)
Albumin: 3.5 g/dL (ref 3.5–5.0)
Alkaline Phosphatase: 61 U/L (ref 38–126)
Anion gap: 8 (ref 5–15)
BUN: 9 mg/dL (ref 8–23)
CO2: 24 mmol/L (ref 22–32)
Calcium: 9.1 mg/dL (ref 8.9–10.3)
Chloride: 106 mmol/L (ref 98–111)
Creatinine, Ser: 0.74 mg/dL (ref 0.61–1.24)
GFR calc Af Amer: >60 mL/min (ref >60)
GFR calc non Af Amer: >60 mL/min (ref >60)
Glucose, Bld: 106 mg/dL — ABNORMAL HIGH (ref 70–99)
Potassium: 3.9 mmol/L (ref 3.5–5.1)
Sodium: 138 mmol/L (ref 135–145)
Total Bilirubin: 2.8 mg/dL — ABNORMAL HIGH (ref 0.3–1.2)
Total Protein: 6.4 g/dL — ABNORMAL LOW (ref 6.5–8.1)

## 2019-06-23 LAB — CBC WITH DIFFERENTIAL/PLATELET
Abs Immature Granulocytes: 0.01 10*3/uL (ref 0.00–0.07)
Basophils Absolute: 0 10*3/uL (ref 0.0–0.1)
Basophils Relative: 0 %
Eosinophils Absolute: 0 10*3/uL (ref 0.0–0.5)
Eosinophils Relative: 0 %
HCT: 50.7 % (ref 39.0–52.0)
Hemoglobin: 17.3 g/dL — ABNORMAL HIGH (ref 13.0–17.0)
Immature Granulocytes: 0 %
Lymphocytes Relative: 19 %
Lymphs Abs: 0.9 10*3/uL (ref 0.7–4.0)
MCH: 34.4 pg — ABNORMAL HIGH (ref 26.0–34.0)
MCHC: 34.1 g/dL (ref 30.0–36.0)
MCV: 100.8 fL — ABNORMAL HIGH (ref 80.0–100.0)
Monocytes Absolute: 0.5 10*3/uL (ref 0.1–1.0)
Monocytes Relative: 10 %
Neutro Abs: 3.5 10*3/uL (ref 1.7–7.7)
Neutrophils Relative %: 71 %
Platelets: 126 10*3/uL — ABNORMAL LOW (ref 150–400)
RBC: 5.03 MIL/uL (ref 4.22–5.81)
RDW: 12.1 % (ref 11.5–15.5)
WBC: 4.9 10*3/uL (ref 4.0–10.5)
nRBC: 0 % (ref 0.0–0.2)

## 2019-06-23 MED ORDER — RIVAROXABAN 10 MG PO TABS: 10.0000 mg | ORAL_TABLET | Freq: Every day | ORAL | 0 refills | Status: AC

## 2019-06-23 NOTE — ED Triage Notes (Signed)
  Patient comes in with R leg pain that started 3 days ago.  Patient states it feels like a vein is swollen in his leg.  Patient has swelling that starts in R inner thigh and tracks down to calf. Swelling is red but not warm.  Patient states its painful at all times and worse when ambulating.  Patient denies no numbness or tingling in R leg and has good pulses.  Pain 5/10.  Patient took 325 ASA prior to arrival.

## 2019-06-23 NOTE — ED Provider Notes (Signed)
[facility] EMERGENCY DEPARTMENT Provider Note   CSN: [id number] Arrival date & time: 06/23/19  7846     History Chief Complaint  Patient presents with   Leg Pain    Keith Wheeler is a 68 y.o. male.  HPI        68 year old male presents with concern for right lower extremity pain and vein distention.  Reports that it has been going on for about 2 to 3 days.  Denies any trauma, new overuse, or specific activity that run gone.  Reports that when he is at rest, he can tell the pain is there, however it is minimal.  It becomes moderate with ambulation, "mid-range, not excruciating pain."  No history of DVT, PE, recent travel, recent surgery, family history of blood clots.  No smoking.  Denies fevers, chest pain, shortness of breath.   Past Medical History:  Diagnosis Date   IBS (irritable bowel syndrome)    Wears glasses     There are no problems to display for this patient.   Past Surgical History:  Procedure Laterality Date   COLONOSCOPY     ORIF ANKLE FRACTURE Left 10/05/2014   Procedure: LEFT ANKLE OPEN REDUCTION INTERNAL FIXATION (ORIF) MEDIAL MALLEOLUS;  Surgeon: Mckinley Jewel, MD;  Location: [facility];  Service: Orthopedics;  Laterality: Left;  ANESTHESIA:  GENERAL, BLOCK   TONSILLECTOMY         History reviewed. No pertinent family history.  Social History   Tobacco Use   Smoking status: Never Smoker  Substance Use Topics   Alcohol use: Yes    Comment: 4 glasses wine a night   Drug use: No    Home Medications Prior to Admission medications   Medication Sig Start Date End Date Taking? Authorizing Provider  docusate sodium (COLACE) 100 MG capsule Take 1 capsule (100 mg total) by mouth every 12 (twelve) hours. 04/11/18   Arby Barrette, MD  escitalopram (LEXAPRO) 5 MG tablet Take 5 mg by mouth at bedtime.    [provider]  Multiple Vitamins-Minerals (MULTIVITAMIN WITH MINERALS) tablet Take 1 tablet by  mouth daily.    [provider]  NON FORMULARY every morning. ostragalus root    [provider]  ondansetron (ZOFRAN ODT) 4 MG disintegrating tablet Take 1 tablet (4 mg total) by mouth every 4 (four) hours as needed for nausea or vomiting. 04/11/18   Arby Barrette, MD  oxyCODONE-acetaminophen (PERCOCET) 5-325 MG tablet Take 1-2 tablets by mouth every 6 (six) hours as needed. 04/11/18   Arby Barrette, MD  rivaroxaban (XARELTO) 10 MG TABS tablet Take 1 tablet (10 mg total) by mouth daily. 06/23/19 08/07/19  Alvira Monday, MD  tamsulosin (FLOMAX) 0.4 MG CAPS capsule Take 1 capsule (0.4 mg total) by mouth daily. 04/11/18   Arby Barrette, MD    Allergies    Rice and Sardine oil  Review of Systems   Review of Systems  Constitutional: Negative for fever.  HENT: Negative for sore throat.   Respiratory: Negative for shortness of breath.   Cardiovascular: Negative for chest pain and leg swelling (no swelling but pain and swelling over vein).  Gastrointestinal: Negative for abdominal pain.  Musculoskeletal: Positive for myalgias.  Skin: Negative for rash. Color change: slight redness.  Neurological: Negative for syncope.    Physical Exam Updated Vital Signs BP (!) 162/98 (BP Location: Right Arm)    Pulse 72    Temp 98.3 F (36.8 C) (Oral)    Resp 18  Ht 5\' 8"  (1.727 m)    Wt 74.8 kg    SpO2 100%    BMI 25.09 kg/m   Physical Exam Vitals and nursing note reviewed.  Constitutional:      General: He is not in acute distress.    Appearance: He is well-developed. He is not diaphoretic.  HENT:     Head: Normocephalic and atraumatic.  Eyes:     Conjunctiva/sclera: Conjunctivae normal.  Cardiovascular:     Rate and Rhythm: Normal rate and regular rhythm.     Pulses: Normal pulses.     Comments: 2+ DP/PT pulses Pulmonary:     Effort: Pulmonary effort is normal. No respiratory distress.  Musculoskeletal:        General: Tenderness (right calf, area over medial leg  extending 5cm from groin to back of knee, mild overlying erythema over vein) present.     Cervical back: Normal range of motion.  Skin:    General: Skin is warm and dry.  Neurological:     Mental Status: He is alert and oriented to person, place, and time.     ED Results / Procedures / Treatments   Labs (all labs ordered are listed, but only abnormal results are displayed) Labs Reviewed  CBC WITH DIFFERENTIAL/PLATELET - Abnormal; Notable for the following components:      Result Value   Hemoglobin 17.3 (*)    MCV 100.8 (*)    MCH 34.4 (*)    Platelets 126 (*)    All other components within normal limits  COMPREHENSIVE METABOLIC PANEL - Abnormal; Notable for the following components:   Glucose, Bld 106 (*)    Total Protein 6.4 (*)    Total Bilirubin 2.8 (*)    All other components within normal limits    EKG None  Radiology US Venous Img Lower Unilateral Right  Result Date: 06/23/2019 CLINICAL DATA:  Right lower extremity pain and edema for the past 3 days. Evaluate for DVT. EXAM: RIGHT LOWER EXTREMITY VENOUS DOPPLER ULTRASOUND TECHNIQUE: Gray-scale sonography with graded compression, as well as color Doppler and duplex ultrasound were performed to evaluate the lower extremity deep venous systems from the level of the common femoral vein and including the common femoral, femoral, profunda femoral, popliteal and calf veins including the posterior tibial, peroneal and gastrocnemius veins when visible. The superficial great saphenous vein was also interrogated. Spectral Doppler was utilized to evaluate flow at rest and with distal augmentation maneuvers in the common femoral, femoral and popliteal veins. COMPARISON:  None. FINDINGS: Contralateral Common Femoral Vein: Respiratory phasicity is normal and symmetric with the symptomatic side. No evidence of thrombus. Normal compressibility. Common Femoral Vein: No evidence of thrombus. Normal compressibility, respiratory phasicity and  response to augmentation. Saphenofemoral Junction: No evidence of thrombus. Normal compressibility and flow on color Doppler imaging. Profunda Femoral Vein: No evidence of thrombus. Normal compressibility and flow on color Doppler imaging. Femoral Vein: No evidence of thrombus. Normal compressibility, respiratory phasicity and response to augmentation. Popliteal Vein: No evidence of thrombus. Normal compressibility, respiratory phasicity and response to augmentation. Calf Veins: No evidence of thrombus. Normal compressibility and flow on color Doppler imaging. Superficial Great Saphenous Vein: While the proximal aspect of the greater saphenous vein appears widely patent (image 20), there is hypoechoic occlusive thrombus extending from the level of the right mid thigh (image 21) through the level of the mid calf (image 27). Venous Reflux:  None. Other Findings: There is hypoechoic occlusive thrombus seen within the right lesser saphenous  vein (image 32). IMPRESSION: 1. No evidence of DVT within the right lower extremity. 2. The examination is positive for occlusive superficial thrombophlebitis involving the greater and lesser saphenous veins. There is no extension of this occlusive SVT to the deep venous system of the right lower extremity. Electronically Signed   By: Simonne ComeJohn  Watts M.D.   On: 06/23/2019 09:50    Procedures Procedures (including critical care time)  Medications Ordered in ED Medications - No data to display  ED Course  I have reviewed the triage vital signs and the nursing notes.  Pertinent labs & imaging results that were available during my care of the patient were reviewed by me and considered in my medical decision making (see chart for details).    MDM Rules/Calculators/A&P  68 year old male presents with concern for right lower extremity pain and vein distention.  DDx includes superficial thrombophlebitis, DVT. No sign of significant cellulitis, no sign of acute arterial thrombus,  no hx to suggest fx.   DVT study completed and shows no sign of DVT however does show Greater Saphenous Vein superficial thrombus with extension from right mid thigh to mid calf with proximal aspect of vein patent. Given GSA thrombus greater than 5cm will initiate prophylactic anticoagulation with 10mg  xarelto daily for 45 days and have pt follow up with PCP. Discussed risks of anticoagulation, recommend alcohol cessation via weaning to prevent withdrawal, avoidance of NSAIDs.  Tylenol for pain, other supportive care, warm compresses, elevation. Patient discharged in stable condition with understanding of reasons to return.     Final Clinical Impression(s) / ED Diagnoses Final diagnoses:  Right leg pain  Thrombophlebitis of superficial veins of right lower extremity    Rx / DC Orders ED Discharge Orders         Ordered    rivaroxaban (XARELTO) 10 MG TABS tablet  Daily     06/23/19 1003           Alvira MondaySchlossman, Ruthel Martine, MD 06/23/19 1011

## 2019-06-23 NOTE — Discharge Instructions (Signed)
You have been diagnosed with a clot in the superficial vein, not in the deep veins. Given it is in the greater saphenous vein and size of clot, we will still start you on a low dose prophylactic blood thinner and have you continue other care including heat and elevation and follow up closely with your physician.

## 2019-08-03 ENCOUNTER — Other Ambulatory Visit: Payer: Self-pay

## 2019-08-03 ENCOUNTER — Encounter: Payer: Self-pay | Admitting: Vascular Surgery

## 2019-08-03 ENCOUNTER — Ambulatory Visit (INDEPENDENT_AMBULATORY_CARE_PROVIDER_SITE_OTHER): Payer: Medicare PPO | Admitting: Vascular Surgery

## 2019-08-03 VITALS — BP 135/84 | HR 76 | Temp 98.2°F | Resp 20 | Ht 68.0 in | Wt 177.7 lb

## 2019-08-03 DIAGNOSIS — I8001 Phlebitis and thrombophlebitis of superficial vessels of right lower extremity: Secondary | ICD-10-CM

## 2019-08-03 NOTE — Progress Notes (Signed)
REASON FOR CONSULT:    Thrombophlebitis of great sure and lesser saphenous vein.  The consult is requested by Dr. Bing Matter.  ASSESSMENT & PLAN:   THROMBOPHLEBITIS RIGHT GREAT SAPHENOUS VEIN AND RIGHT SMALL SAPHENOUS VEIN: This patient developed thrombophlebitis in December.  He has been on Xarelto now for over 1 month.  His symptoms have resolved.  At this point I think it is safe to discontinue his Xarelto.  I have discussed the importance of intermittent leg elevation and the proper positioning for this.  I have encouraged him to get some knee-high compression stockings with a gradient of 15 to 20 mmHg.  I have encouraged him to exercise and avoid prolonged sitting and standing.  If he develops recurrent venous symptoms I be happy to see him back at anytime in the future.  Deitra Mayo, MD Office: [phone]   HPI:   Keith Wheeler is a pleasant 69 y.o. male, who was referred after a venous duplex scan showed superficial thrombophlebitis involving the right great saphenous vein and right small saphenous vein.  I reviewed the records from the referring office.  The patient was seen on 06/27/2019 with pain and swelling of the right lower extremity which prompted the duplex scan.  This showed no evidence of DVT or clot in the right great saphenous vein and right small saphenous vein.  He had some pain and tenderness associated with this.  He was referred for vascular consultation.  The patient denies any previous history of DVT.  Has had no previous venous procedures.  He had no injury to the leg and had not had any recent long travel.  This occurred spontaneously.  He simply developed some redness and swelling in his right leg.  This was treated with elevation, warm compresses, and ibuprofen as needed for pain.  His symptoms subsequently resolved.  The patient is very active and exercises daily.  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  Past Medical History:  Diagnosis Date  . DVT (deep venous thrombosis) ([facility])   . IBS (irritable bowel syndrome)   . Wears glasses     History reviewed. No pertinent family history.  SOCIAL HISTORY: Social History   Socioeconomic History  . Marital status: Married    Spouse name: Not on file  . Number of children: Not on file  . Years of education: Not on file  . Highest education level: Not on file  Occupational History  . Not on file  Tobacco Use  . Smoking status: Never Smoker  . Smokeless tobacco: Never Used  Substance and Sexual Activity  . Alcohol use: Yes    Comment: 4 glasses wine a night  . Drug use: No  . Sexual activity: Not on file  Other Topics Concern  . Not on file  Social History Narrative  . Not on file   Social Determinants of Health   Financial Resource Strain:   . Difficulty of Paying Living Expenses: Not on file  Food Insecurity:   . Worried About Charity fundraiser in the Last Year: Not on file  . Ran Out of Food in the Last Year: Not on file  Transportation Needs:   . Lack of Transportation (Medical): Not on file  . Lack of Transportation (Non-Medical): Not on file  Physical Activity:   . Days of Exercise per Week: Not on file  . Minutes of Exercise per Session: Not on  file  Stress:   . Feeling of Stress : Not on file  Social Connections:   . Frequency of Communication with Friends and Family: Not on file  . Frequency of Social Gatherings with Friends and Family: Not on file  . Attends Religious Services: Not on file  . Active Member of Clubs or Organizations: Not on file  . Attends Banker Meetings: Not on file  . Marital Status: Not on file  Intimate Partner Violence:   . Fear of Current or Ex-Partner: Not on file  . Emotionally Abused: Not on file  . Physically Abused: Not on file  . Sexually Abused: Not on file    Allergies  Allergen Reactions  . Rice Swelling  . Sardine Oil Swelling     Current Outpatient Medications  Medication Sig Dispense Refill  . escitalopram (LEXAPRO) 5 MG tablet Take 5 mg by mouth at bedtime.    . Multiple Vitamins-Minerals (MULTIVITAMIN WITH MINERALS) tablet Take 1 tablet by mouth daily.    . NON FORMULARY every morning. ostragalus root    . rivaroxaban (XARELTO) 10 MG TABS tablet Take 1 tablet (10 mg total) by mouth daily. 45 tablet 0  . vardenafil (LEVITRA) 20 MG tablet Take one pill prior to intercourse     No current facility-administered medications for this visit.    REVIEW OF SYSTEMS:  [X]  denotes positive finding, [ ]  denotes negative finding Cardiac  Comments:  Chest pain or chest pressure:    Shortness of breath upon exertion:    Short of breath when lying flat:    Irregular heart rhythm:        Vascular    Pain in calf, thigh, or hip brought on by ambulation:    Pain in feet at night that wakes you up from your sleep:     Blood clot in your veins: x   Leg swelling:  x       Pulmonary    Oxygen at home:    Productive cough:     Wheezing:         Neurologic    Sudden weakness in arms or legs:     Sudden numbness in arms or legs:     Sudden onset of difficulty speaking or slurred speech:    Temporary loss of vision in one eye:     Problems with dizziness:         Gastrointestinal    Blood in stool:     Vomited blood:         Genitourinary    Burning when urinating:     Blood in urine:        Psychiatric    Major depression:         Hematologic    Bleeding problems:    Problems with blood clotting too easily:        Skin    Rashes or ulcers:        Constitutional    Fever or chills:     PHYSICAL EXAM:   Vitals:   08/03/19 0922  BP: 135/84  Pulse: 76  Resp: 20  Temp: 98.2 F (36.8 C)  SpO2: 99%  Weight: 177 lb 11.2 oz (80.6 kg)  Height: 5\' 8"  (1.727 m)    GENERAL: The patient is a well-nourished male, in no acute distress. The vital signs are documented above. CARDIAC: There is a regular rate  and rhythm.  VASCULAR: I do not detect carotid bruits. He has palpable  dorsalis pedis and posterior tibial pulses bilaterally. His right great saphenous vein has a palpable cord without significant induration or erythema.  I do not palpate any significant induration over the small saphenous vein.  He has no significant varicose veins and no significant leg swelling. PULMONARY: There is good air exchange bilaterally without wheezing or rales. ABDOMEN: Soft and non-tender with normal pitched bowel sounds.  MUSCULOSKELETAL: There are no major deformities or cyanosis. NEUROLOGIC: No focal weakness or paresthesias are detected. SKIN: There are no ulcers or rashes noted. PSYCHIATRIC: The patient has a normal affect.  DATA:    VENOUS DUPLEX: I reviewed the venous duplex scan that was done on 06/23/2019.  This showed no evidence of DVT of the right lower extremity.  There was superficial thrombophlebitis involving the right great saphenous vein and right small saphenous vein.

## 2019-08-04 ENCOUNTER — Ambulatory Visit: Payer: Medicare PPO | Attending: Internal Medicine

## 2019-08-04 DIAGNOSIS — Z23 Encounter for immunization: Secondary | ICD-10-CM

## 2019-08-04 NOTE — Progress Notes (Signed)
   Covid-19 Vaccination Clinic  Name:  Keith Wheeler    MRN: [id number] DOB: June 12, 1951  08/04/2019  Mr. [name] was observed post Covid-19 immunization for 15 minutes without incidence. He was provided with Vaccine Information Sheet and instruction to access the V-Safe system.   [name] was instructed to call 911 with any severe reactions post vaccine: Marland Kitchen Difficulty breathing  . Swelling of your face and throat  . A fast heartbeat  . A bad rash all over your body  . Dizziness and weakness    Immunizations Administered    Name Date Dose VIS Date Route   Pfizer COVID-19 Vaccine 08/04/2019  9:59 AM 0.3 mL 06/24/2019 Intramuscular   Manufacturer: [employer], [employer]   Lot: [id number]   NDC: [address]-0

## 2019-08-25 ENCOUNTER — Ambulatory Visit: Payer: Medicare PPO | Attending: Internal Medicine

## 2019-08-25 DIAGNOSIS — Z23 Encounter for immunization: Secondary | ICD-10-CM

## 2019-08-25 NOTE — Progress Notes (Signed)
    Covid-19 Vaccination Clinic  Name:  Keith Wheeler    MRN: [id number] DOB: 15-Jun-1951  08/25/2019  Mr. Keith Wheeler was observed post Covid-19 immunization for 15 minutes without incidence. He was provided with Vaccine Information Sheet and instruction to access the V-Safe system.   Mr. Keith Wheeler was instructed to call 911 with any severe reactions post vaccine: Marland Kitchen Difficulty breathing  . Swelling of your face and throat  . A fast heartbeat  . A bad rash all over your body  . Dizziness and weakness    Immunizations Administered    Name Date Dose VIS Date Route   Pfizer COVID-19 Vaccine 08/25/2019  8:34 AM 0.3 mL 06/24/2019 Intramuscular   Manufacturer: [employer], [employer]   Lot: [id number]   NDC: [address]-4

## 2019-11-17 ENCOUNTER — Other Ambulatory Visit: Payer: Self-pay | Admitting: Gastroenterology

## 2019-11-17 DIAGNOSIS — R1310 Dysphagia, unspecified: Secondary | ICD-10-CM

## 2019-11-28 ENCOUNTER — Ambulatory Visit
Admission: RE | Admit: 2019-11-28 | Discharge: 2019-11-28 | Disposition: A | Discharge status: Home / Self Care | Payer: Medicare PPO | Source: Ambulatory Visit | Attending: Gastroenterology | Admitting: Gastroenterology

## 2019-11-28 DIAGNOSIS — R1310 Dysphagia, unspecified: Secondary | ICD-10-CM

## 2020-11-05 ENCOUNTER — Other Ambulatory Visit: Payer: Self-pay

## 2020-11-05 ENCOUNTER — Emergency Department (HOSPITAL_BASED_OUTPATIENT_CLINIC_OR_DEPARTMENT_OTHER): Payer: Medicare PPO

## 2020-11-05 ENCOUNTER — Encounter (HOSPITAL_BASED_OUTPATIENT_CLINIC_OR_DEPARTMENT_OTHER): Payer: Self-pay

## 2020-11-05 ENCOUNTER — Emergency Department (HOSPITAL_BASED_OUTPATIENT_CLINIC_OR_DEPARTMENT_OTHER)
Admission: EM | Admit: 2020-11-05 | Discharge: 2020-11-05 | Disposition: A | Discharge status: Home / Self Care | Payer: Medicare PPO | Attending: Emergency Medicine | Admitting: Emergency Medicine

## 2020-11-05 DIAGNOSIS — S32010A Wedge compression fracture of first lumbar vertebra, initial encounter for closed fracture: Secondary | ICD-10-CM | POA: Diagnosis not present

## 2020-11-05 DIAGNOSIS — Z7901 Long term (current) use of anticoagulants: Secondary | ICD-10-CM | POA: Insufficient documentation

## 2020-11-05 DIAGNOSIS — W11XXXA Fall on and from ladder, initial encounter: Secondary | ICD-10-CM | POA: Insufficient documentation

## 2020-11-05 DIAGNOSIS — S3992XA Unspecified injury of lower back, initial encounter: Secondary | ICD-10-CM | POA: Diagnosis present

## 2020-11-05 NOTE — Progress Notes (Signed)
Orthopedic Tech Progress Note Patient Details:  Keith Wheeler [birth year] [id number] Called in order to [facility] for a LSO BRACE Patient ID: DELFINO FRIESEN, male   DOB: 1951/02/02, 70 y.o.   MRN: [id number]   Donald Pore 11/05/2020, 12:46 PM

## 2020-11-05 NOTE — ED Triage Notes (Signed)
Reports fall from 1.5 ft foot ladder last Thursday. Landed on feet but has bilateral dull lumbar pain. Takes 1 ibuprofen in the AM for the pain. Took one this morning. Pain right now 4/10.

## 2020-11-05 NOTE — ED Provider Notes (Signed)
[facility] EMERGENCY DEPARTMENT Provider Note   CSN: [id number] Arrival date & time: 11/05/20  4166     History Chief Complaint  Patient presents with  . Back Pain    Keith Wheeler is a 70 y.o. male.  He is here for evaluation of injuries after a 1-1/2 foot fall off a ladder 5 days ago.  He said he lost his balance while trying to clean the gutter.  Landed on his feet and buttocks and then back to attack.  No loss of consciousness no head strike.  Complains of pain in his low back and pelvis worse with moving and twisting.  No numbness or tingling.  No abdominal radiation.  No radiation down the legs.  No bowel or bladder incontinence.  Not on any blood thinners   Back Pain Location:  Gluteal region and lumbar spine Quality:  Aching Radiates to:  Does not radiate Pain severity:  Moderate Pain is:  Same all the time Onset quality:  Sudden Duration:  5 days Timing:  Intermittent Progression:  Unchanged Chronicity:  New Context: falling   Relieved by:  Nothing Worsened by:  Bending and twisting Ineffective treatments:  NSAIDs Associated symptoms: no abdominal pain, no bladder incontinence, no bowel incontinence, no chest pain, no dysuria, no fever, no headaches, no numbness and no weakness        Past Medical History:  Diagnosis Date  . DVT (deep venous thrombosis) ([facility])   . IBS (irritable bowel syndrome)   . Wears glasses     There are no problems to display for this patient.   Past Surgical History:  Procedure Laterality Date  . COLONOSCOPY    . ORIF ANKLE FRACTURE Left 10/05/2014   Procedure: LEFT ANKLE OPEN REDUCTION INTERNAL FIXATION (ORIF) MEDIAL MALLEOLUS;  Surgeon: Mckinley Jewel, MD;  Location: [facility];  Service: Orthopedics;  Laterality: Left;  ANESTHESIA:  GENERAL, BLOCK  . TONSILLECTOMY         History reviewed. No pertinent family history.  Social History   Tobacco Use  . Smoking status: Never Smoker  . Smokeless  tobacco: Never Used  Vaping Use  . Vaping Use: Never used  Substance Use Topics  . Alcohol use: Yes    Comment: 4 glasses wine a night  . Drug use: No    Home Medications Prior to Admission medications   Medication Sig Start Date End Date Taking? Authorizing Provider  escitalopram (LEXAPRO) 5 MG tablet Take 5 mg by mouth at bedtime.    [provider]  Multiple Vitamins-Minerals (MULTIVITAMIN WITH MINERALS) tablet Take 1 tablet by mouth daily.    [provider]  NON FORMULARY every morning. ostragalus root    [provider]  rivaroxaban (XARELTO) 10 MG TABS tablet Take 1 tablet (10 mg total) by mouth daily. 06/23/19 08/07/19  Alvira Monday, MD  vardenafil (LEVITRA) 20 MG tablet Take one pill prior to intercourse 05/10/19   [provider]    Allergies    Rice and Sardine oil  Review of Systems   Review of Systems  Constitutional: Negative for fever.  HENT: Negative for sore throat.   Eyes: Negative for visual disturbance.  Respiratory: Negative for shortness of breath.   Cardiovascular: Negative for chest pain.  Gastrointestinal: Negative for abdominal pain and bowel incontinence.  Genitourinary: Negative for bladder incontinence and dysuria.  Musculoskeletal: Positive for back pain.  Skin: Negative for rash.  Neurological: Negative for weakness, numbness and headaches.  Physical Exam Updated Vital Signs BP (!) 155/99 (BP Location: Right Arm)   Pulse 72   Temp 98.8 F (37.1 C) (Oral)   Resp 19   Ht 5\' 9"  (1.753 m)   Wt 79.4 kg   SpO2 97%   BMI 25.84 kg/m   Physical Exam Vitals and nursing note reviewed.  Constitutional:      Appearance: He is well-developed.  HENT:     Head: Normocephalic and atraumatic.  Eyes:     Conjunctiva/sclera: Conjunctivae normal.  Cardiovascular:     Rate and Rhythm: Normal rate and regular rhythm.     Heart sounds: No murmur heard.   Pulmonary:     Effort: Pulmonary effort is normal. No  respiratory distress.     Breath sounds: Normal breath sounds.  Abdominal:     Palpations: Abdomen is soft.     Tenderness: There is no abdominal tenderness.  Musculoskeletal:        General: Tenderness present. No deformity.     Cervical back: Neck supple.     Comments: He has no midline cervical thoracic or lumbar tenderness.  There are some faint tenderness over the top of his iliac crests and through the sacral area.  Skin:    General: Skin is warm and dry.  Neurological:     General: No focal deficit present.     Mental Status: He is alert.     Sensory: No sensory deficit.     Motor: No weakness.     ED Results / Procedures / Treatments   Labs (all labs ordered are listed, but only abnormal results are displayed) Labs Reviewed - No data to display  EKG None  Radiology DG Lumbar Spine Complete  Result Date: 11/05/2020 CLINICAL DATA:  Low back and buttock/pelvic pain after a recent fall from a ladder. EXAM: LUMBAR SPINE - COMPLETE 4+ VIEW COMPARISON:  CT abdomen and pelvis 04/11/2018 FINDINGS: There are 5 non rib-bearing lumbar type vertebrae as well as a partially lumbarized S1. Trace retrolisthesis of L5 on S1 is unchanged. There is a new L1 superior endplate compression fracture with mild anterior vertebral body height loss which is likely acute. Intervertebral disc space heights are preserved other than minimal narrowing at L4-5. There is moderate anterior vertebral spurring at L1-2 and T11-12. Mild lower lumbar facet arthrosis is noted. IMPRESSION: Likely acute L1 compression fracture. Electronically Signed   By: 04/13/2018 M.D.   On: 11/05/2020 10:35   DG Pelvis 1-2 Views  Result Date: 11/05/2020 CLINICAL DATA:  Low back and buttock/pelvic pain after a recent fall from a ladder. EXAM: PELVIS - 1-2 VIEW COMPARISON:  None. FINDINGS: No acute fracture or pelvic diastasis is evident. Hip joint space widths are preserved. The soft tissues are unremarkable. IMPRESSION:  Negative. Electronically Signed   By: 11/07/2020 M.D.   On: 11/05/2020 10:36   CT Lumbar Spine Wo Contrast  Result Date: 11/05/2020 CLINICAL DATA:  11/07/2020 from ladder 4 days ago.  L1 fracture. EXAM: CT LUMBAR SPINE WITHOUT CONTRAST TECHNIQUE: Multidetector CT imaging of the lumbar spine was performed without intravenous contrast administration. Multiplanar CT image reconstructions were also generated. COMPARISON:  Lumbar radiographs 11/05/2020 FINDINGS: Segmentation: S1 is a transitional segment. Alignment: Normal Vertebrae: Mild superior endplate fracture of L1 appears acute. Posterior elements intact. No retropulsion of bone into the canal. No other fracture identified. Paraspinal and other soft tissues: Negative for paraspinous mass or adenopathy. Disc levels: T12-L1: Negative for stenosis L1-2: Negative for stenosis  L2-3: Mild disc bulging.  Negative for stenosis L3-4: Mild disc bulging.  Negative for stenosis L4-5: Mild disc bulging and mild facet degeneration. Negative for stenosis L5-S1: Mild facet degeneration. Negative for disc protrusion or stenosis IMPRESSION: Mild superior endplate fracture of L1 appears acute. No retropulsion of bone or spinal stenosis Mild lumbar degenerative change Electronically Signed   By: Marlan Palau M.D.   On: 11/05/2020 11:36    Procedures Procedures   Medications Ordered in ED Medications - No data to display  ED Course  I have reviewed the triage vital signs and the nursing notes.  Pertinent labs & imaging results that were available during my care of the patient were reviewed by me and considered in my medical decision making (see chart for details).  Clinical Course as of 11/05/20 1718  Mon Nov 05, 2020  1227 Discussed with neurosurgery PA Meyran.  She recommended a lumbosacral corset and follow-up with Dr. Dutch Quint in 2 weeks. [MB]  1338 Brace has been applied by representative.  Patient feels better with it.  Will discharge.  Return instructions  discussed [MB]    Clinical Course User Index [MB] Terrilee Files, MD   MDM Rules/Calculators/A&P                         Differential diagnosis includes musculoskeletal pain, compression fracture, intraperitoneal bleed, pelvic fracture.  X-ray showing possible L1 compression fracture.  CT confirming same no evidence of retropulsion.  Discussed with neurosurgery who recommended LSO corset.  Brace is ordered and applied by technician.  Patient states symptoms feel better in brace.  Return instructions discussed.  Final Clinical Impression(s) / ED Diagnoses Final diagnoses:  Closed compression fracture of body of L1 vertebra [facility])    Rx / DC Orders ED Discharge Orders    None       Terrilee Files, MD 11/05/20 1720

## 2020-11-05 NOTE — Discharge Instructions (Signed)
You were seen in the emergency department for evaluation of back pain after a fall.  You had x-rays and a CAT scan of your back which showed an L1 compression fracture.  Neurosurgery is recommending a brace to use while out of bed.  Please make a follow-up appointment with Dr. Dutch Quint in 2 weeks.  Return to the emergency department if any neurologic symptoms such as numbness or weakness.  Tylenol or ibuprofen as needed for pain.

## 2021-12-19 IMAGING — DX DG LUMBAR SPINE COMPLETE 4+V
5 series · 5 of 5 positions shown · non-contrast
Comparison: CT abdomen and pelvis 04/11/2018

CLINICAL DATA: Low back and buttock/pelvic pain after a recent fall
from a ladder.

EXAM:
LUMBAR SPINE - COMPLETE 4+ VIEW

[l-spine ap]
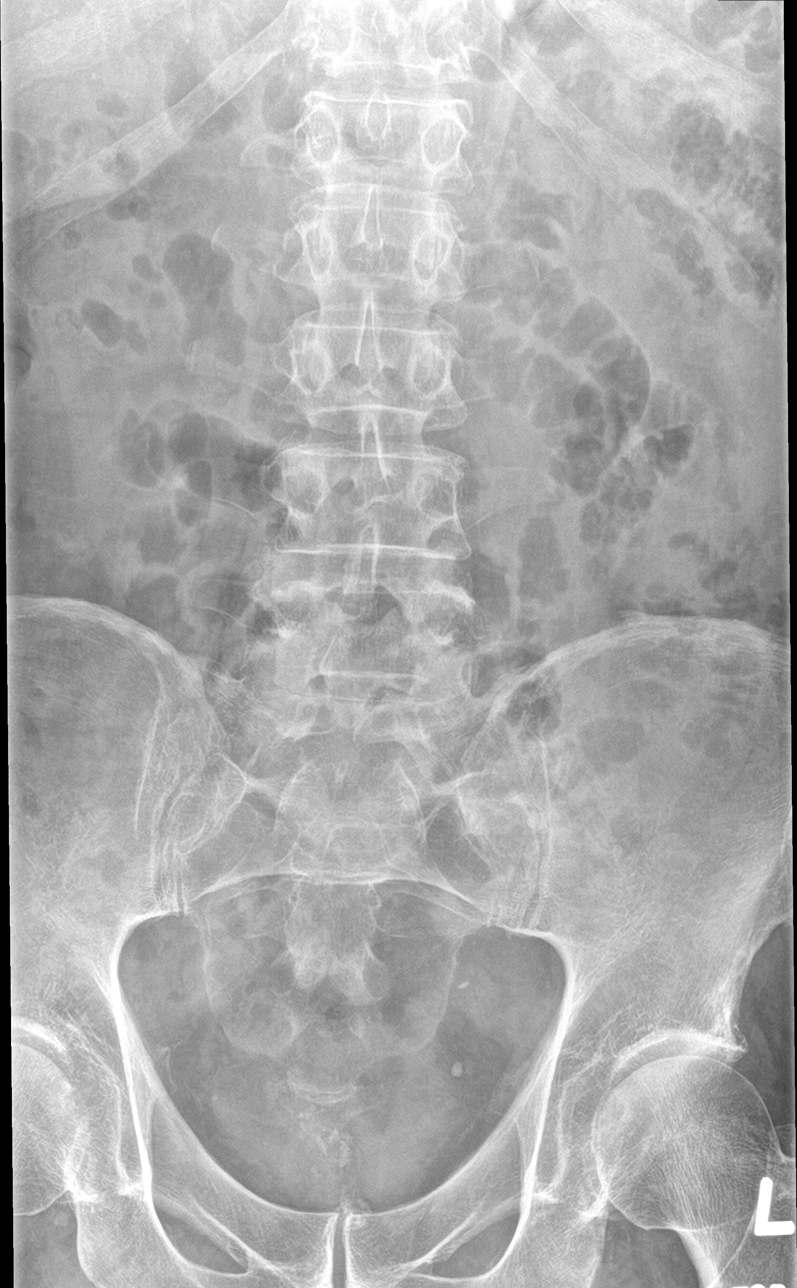

[l-spine obl (1 of 2)]
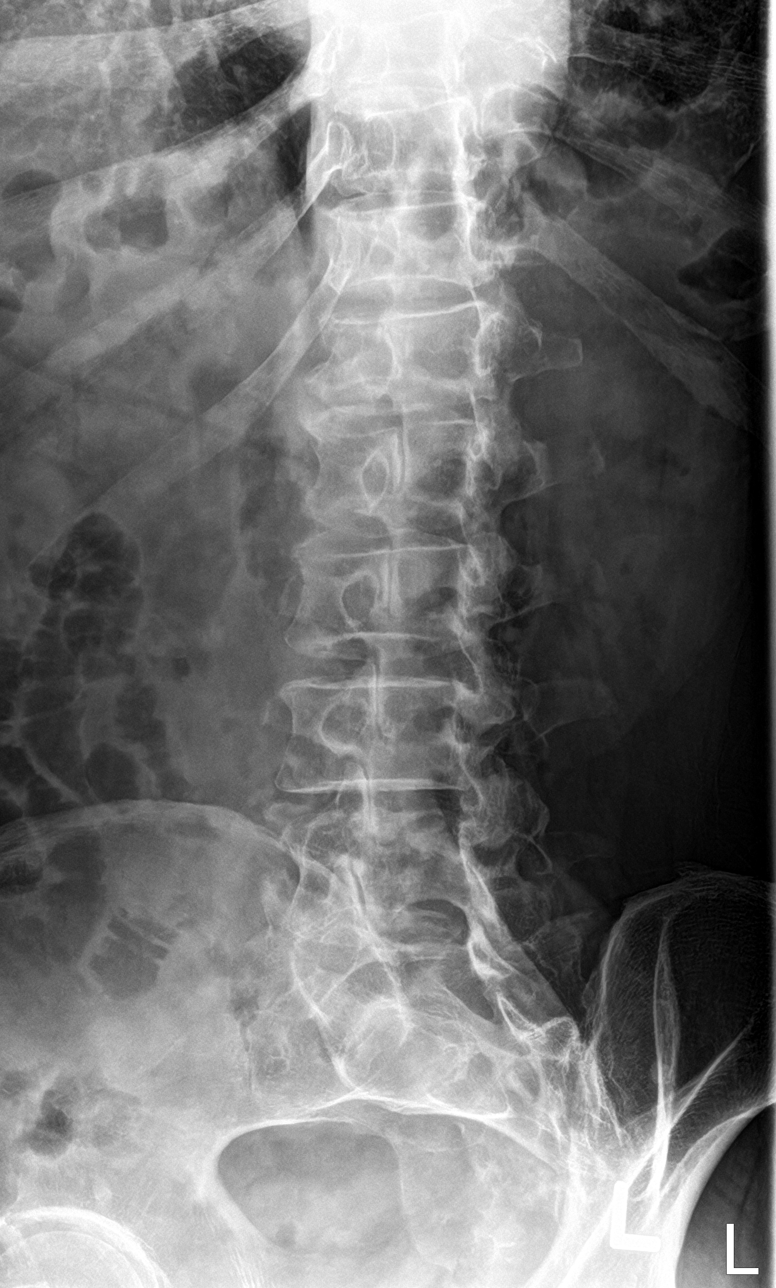

[l-spine lat]
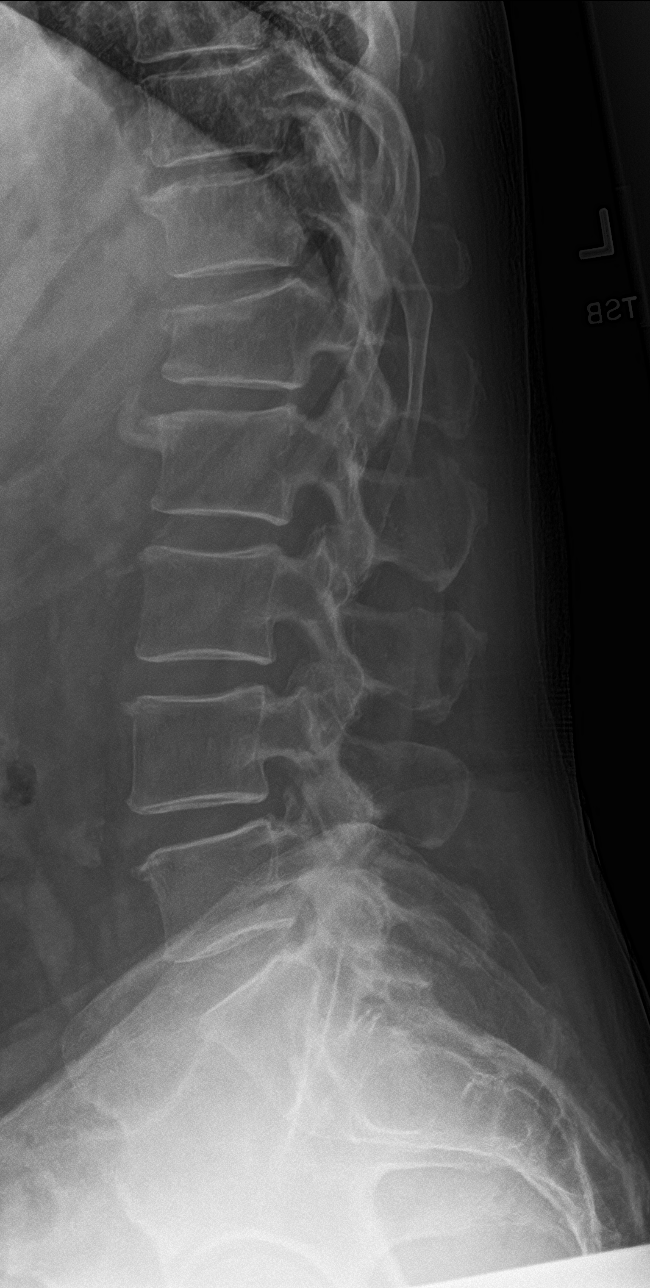

[l-spine spot]
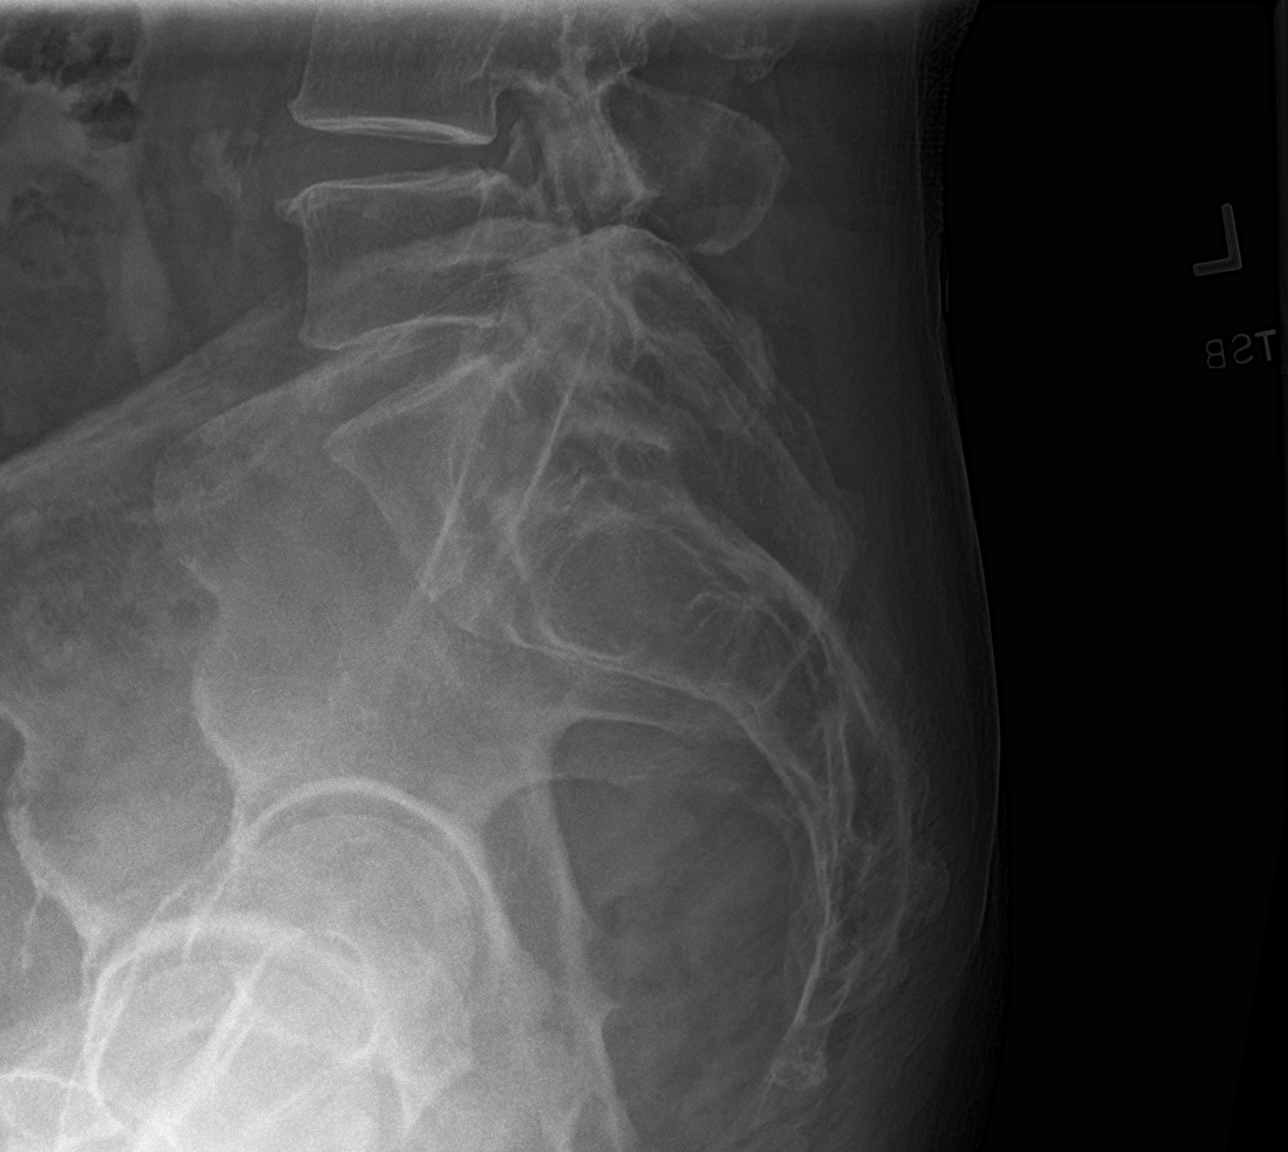

[l-spine obl (2 of 2)]
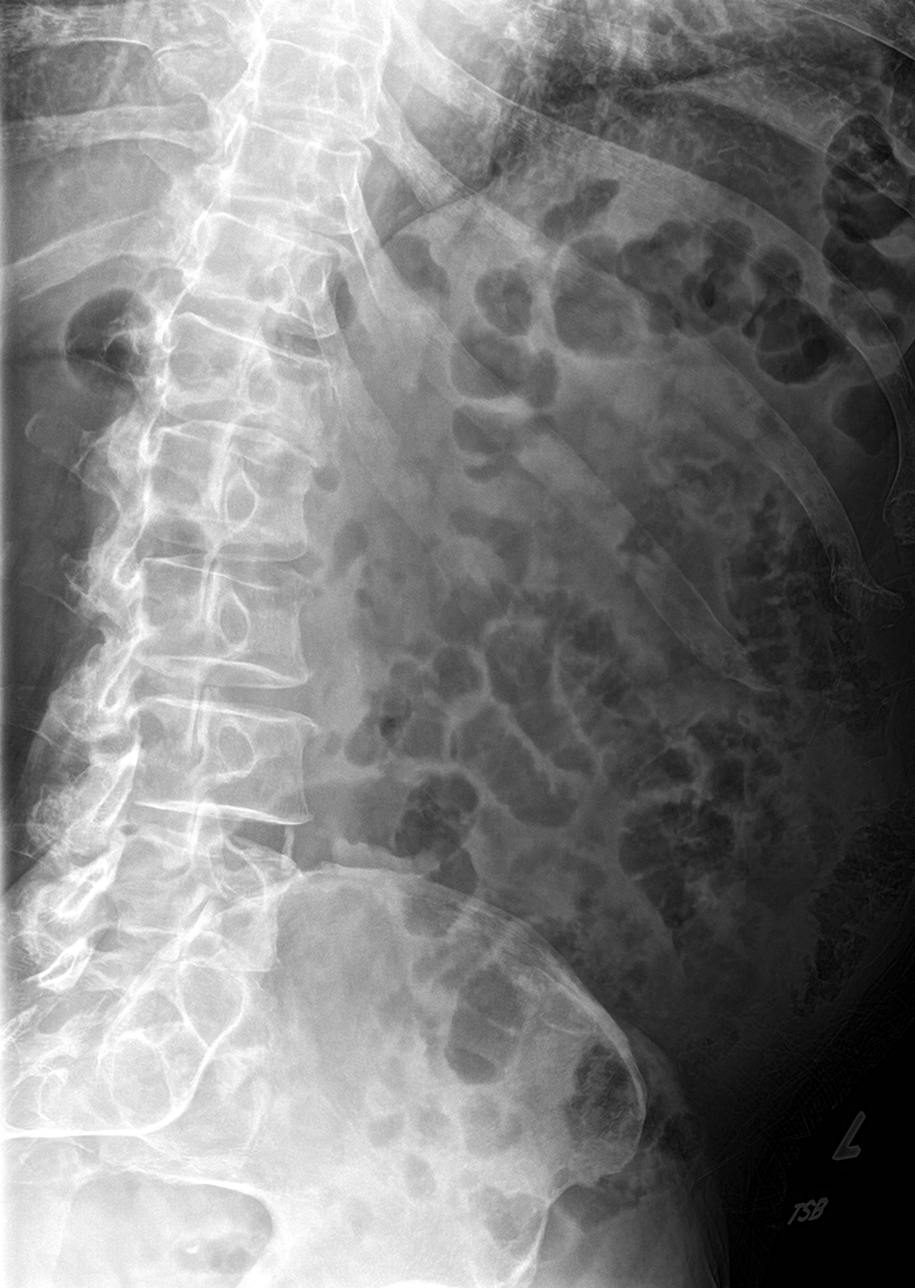

[5 of 5 positions shown; findings below may reference images not displayed]

FINDINGS: There are 5 non rib-bearing lumbar type vertebrae as well as a
partially lumbarized S1. Trace retrolisthesis of L5 on S1 is
unchanged. There is a new L1 superior endplate compression fracture
with mild anterior vertebral body height loss which is likely acute.
Intervertebral disc space heights are preserved other than minimal
narrowing at L4-5. There is moderate anterior vertebral spurring at
L1-2 and T11-12. Mild lower lumbar facet arthrosis is noted.
IMPRESSION: Likely acute L1 compression fracture.
